# Patient Record
Sex: Female | Born: 1985 | Race: Black or African American | Hispanic: No | Marital: Single | Smoking: Never smoker
Health system: Southern US, Community
[De-identification: ages and names within clinical notes are randomized; demographics above are authoritative.]

## PROBLEM LIST (undated history)

## (undated) DIAGNOSIS — J45909 Unspecified asthma, uncomplicated: Secondary | ICD-10-CM

---

## 2016-05-16 ENCOUNTER — Encounter: Payer: Self-pay | Admitting: *Deleted

## 2016-05-16 ENCOUNTER — Ambulatory Visit
Admission: EM | Admit: 2016-05-16 | Discharge: 2016-05-16 | Disposition: A | Discharge status: Home / Self Care | Payer: BLUE CROSS/BLUE SHIELD | Attending: Family Medicine | Admitting: Family Medicine

## 2016-05-16 DIAGNOSIS — N760 Acute vaginitis: Secondary | ICD-10-CM | POA: Diagnosis not present

## 2016-05-16 DIAGNOSIS — B9689 Other specified bacterial agents as the cause of diseases classified elsewhere: Secondary | ICD-10-CM

## 2016-05-16 DIAGNOSIS — A499 Bacterial infection, unspecified: Secondary | ICD-10-CM

## 2016-05-16 HISTORY — DX: Unspecified asthma, uncomplicated: J45.909

## 2016-05-16 LAB — URINALYSIS COMPLETE WITH MICROSCOPIC (ARMC ONLY)
BILIRUBIN URINE: NEGATIVE
Glucose, UA: NEGATIVE mg/dL
Hgb urine dipstick: NEGATIVE
KETONES UR: NEGATIVE mg/dL
LEUKOCYTES UA: NEGATIVE
NITRITE: NEGATIVE
PH: 7 (ref 5.0–8.0)
PROTEIN: NEGATIVE mg/dL
RBC / HPF: NONE SEEN RBC/hpf (ref 0–5)
SPECIFIC GRAVITY, URINE: 1.015 (ref 1.005–1.030)

## 2016-05-16 LAB — WET PREP, GENITAL
SPERM: NONE SEEN
Trich, Wet Prep: NONE SEEN
Yeast Wet Prep HPF POC: NONE SEEN

## 2016-05-16 LAB — CHLAMYDIA/NGC RT PCR (ARMC ONLY)
Chlamydia Tr: NOT DETECTED
N gonorrhoeae: NOT DETECTED

## 2016-05-16 MED ORDER — METRONIDAZOLE 500 MG PO TABS: 500.0000 mg | ORAL_TABLET | Freq: Two times a day (BID) | ORAL | 0 refills | Status: DC

## 2016-05-16 NOTE — ED Triage Notes (Signed)
Vaginal itching and discharge x2 days.

## 2016-05-16 NOTE — ED Provider Notes (Signed)
[facility]    CSN: [id number] Arrival date & time: 05/16/16  [id number]  First Provider Contact:  None       History   Chief Complaint Chief Complaint  Patient presents with  . Vaginal Discharge  . Vaginal Itching    HPI Nichole Holt is a 30 y.o. female.   The history is provided by the patient.  Vaginal Discharge  Quality:  White Severity:  Moderate Onset quality:  Gradual Duration:  3 days Timing:  Constant Progression:  Worsening Chronicity:  New Context: spontaneously   Context: not after intercourse, not after urination, not at rest, not during bowel movement, not during intercourse, not during pregnancy, not during urination, not genital trauma and not recent antibiotic use   Relieved by:  None tried Ineffective treatments:  None tried Associated symptoms: vaginal itching   Associated symptoms: no abdominal pain, no dyspareunia, no dysuria, no fever, no genital lesions, no nausea, no rash, no urinary frequency, no urinary hesitancy, no urinary incontinence and no vomiting   Risk factors: no new sexual partner, no PID and no STI exposure   Vaginal Itching  Pertinent negatives include no abdominal pain.    Past Medical History:  Diagnosis Date  . Asthma     There are no active problems to display for this patient.   History reviewed. No pertinent surgical history.  OB History    No data available       Home Medications    Prior to Admission medications   Medication Sig Start Date End Date Taking? Authorizing Provider  acyclovir (ZOVIRAX) 200 MG capsule Take 200 mg by mouth 5 (five) times daily.   Yes Historical Provider, MD  metroNIDAZOLE (FLAGYL) 500 MG tablet Take 1 tablet (500 mg total) by mouth 2 (two) times daily. 05/16/16   Payton Mccallumrlando Karlo Goeden, MD    Family History History reviewed. No pertinent family history.  Social History Social History  Substance Use Topics  . Smoking status: Never Smoker  . Smokeless tobacco: Never Used  .  Alcohol use Yes     Allergies   Review of patient's allergies indicates no known allergies.   Review of Systems Review of Systems  Constitutional: Negative for fever.  Gastrointestinal: Negative for abdominal pain, nausea and vomiting.  Genitourinary: Positive for vaginal discharge. Negative for bladder incontinence, dyspareunia, dysuria and hesitancy.     Physical Exam Triage Vital Signs ED Triage Vitals  Enc Vitals Group     BP 05/16/16 1930 116/71     Pulse Rate 05/16/16 1930 78     Resp 05/16/16 1930 16     Temp 05/16/16 1930 97.5 F (36.4 C)     Temp Source 05/16/16 1930 Tympanic     SpO2 05/16/16 1930 100 %     Weight 05/16/16 1931 120 lb (54.4 kg)     Height 05/16/16 1931 5\' 3"  (1.6 m)     Head Circumference --      Peak Flow --      Pain Score 05/16/16 2027 0     Pain Loc --      Pain Edu? --      Excl. in GC? --    No data found.   Updated Vital Signs BP 116/71 (BP Location: Left Arm)   Pulse 78   Temp 97.5 F (36.4 C) (Tympanic)   Resp 16   Ht 5\' 3"  (1.6 m)   Wt 120 lb (54.4 kg)   LMP 03/30/2016   SpO2 100%  BMI 21.26 kg/m   Visual Acuity Right Eye Distance:   Left Eye Distance:   Bilateral Distance:    Right Eye Near:   Left Eye Near:    Bilateral Near:     Physical Exam  Constitutional: She appears well-developed and well-nourished. No distress.  Genitourinary: Pelvic exam was performed with patient supine. No labial fusion. There is no rash, tenderness, lesion or injury on the right labia. There is no rash, tenderness, lesion or injury on the left labia. No erythema, tenderness or bleeding in the vagina. No foreign body in the vagina. No signs of injury around the vagina. Vaginal discharge found.  Skin: She is not diaphoretic.  Nursing note and vitals reviewed.    UC Treatments / Results  Labs (all labs ordered are listed, but only abnormal results are displayed) Labs Reviewed  WET PREP, GENITAL - Abnormal; Notable for the  following:       Result Value   Clue Cells Wet Prep HPF POC PRESENT (*)    WBC, Wet Prep HPF POC FEW (*)    All other components within normal limits  URINALYSIS COMPLETEWITH MICROSCOPIC ([facility] ONLY) - Abnormal; Notable for the following:    Bacteria, UA RARE (*)    Squamous Epithelial / LPF 0-5 (*)    All other components within normal limits  URINE CULTURE  CHLAMYDIA/NGC RT PCR ([facility] ONLY)    EKG  EKG Interpretation None       Radiology No results found.  Procedures Procedures (including critical care time)  Medications Ordered in UC Medications - No data to display   Initial Impression / Assessment and Plan / UC Course  I have reviewed the triage vital signs and the nursing notes.  Pertinent labs & imaging results that were available during my care of the patient were reviewed by me and considered in my medical decision making (see chart for details).  Clinical Course      Final Clinical Impressions(s) / UC Diagnoses   Final diagnoses:  BV (bacterial vaginosis)    New Prescriptions Discharge Medication List as of 05/16/2016  8:20 PM    START taking these medications   Details  metroNIDAZOLE (FLAGYL) 500 MG tablet Take 1 tablet (500 mg total) by mouth 2 (two) times daily., Starting Mon 05/16/2016, Normal       1. Lab results and diagnosis reviewed with patient 2. rx as per orders above; reviewed possible side effects, interactions, risks and benefits  3. Follow-up prn if symptoms worsen or don't improve   Payton Mccallum, MD 05/16/16 2041

## 2016-05-18 LAB — URINE CULTURE: CULTURE: NO GROWTH

## 2019-04-11 ENCOUNTER — Ambulatory Visit
Admission: EM | Admit: 2019-04-11 | Discharge: 2019-04-11 | Disposition: A | Discharge status: Home / Self Care | Payer: BLUE CROSS/BLUE SHIELD | Attending: Family Medicine | Admitting: Family Medicine

## 2019-04-11 ENCOUNTER — Other Ambulatory Visit: Payer: Self-pay

## 2019-04-11 ENCOUNTER — Encounter: Payer: Self-pay | Admitting: Emergency Medicine

## 2019-04-11 DIAGNOSIS — Z113 Encounter for screening for infections with a predominantly sexual mode of transmission: Secondary | ICD-10-CM | POA: Diagnosis not present

## 2019-04-11 DIAGNOSIS — N898 Other specified noninflammatory disorders of vagina: Secondary | ICD-10-CM | POA: Diagnosis not present

## 2019-04-11 DIAGNOSIS — N760 Acute vaginitis: Secondary | ICD-10-CM | POA: Diagnosis not present

## 2019-04-11 DIAGNOSIS — Z3202 Encounter for pregnancy test, result negative: Secondary | ICD-10-CM

## 2019-04-11 DIAGNOSIS — B9689 Other specified bacterial agents as the cause of diseases classified elsewhere: Secondary | ICD-10-CM

## 2019-04-11 LAB — URINALYSIS, COMPLETE (UACMP) WITH MICROSCOPIC
Bacteria, UA: NONE SEEN
Bilirubin Urine: NEGATIVE
Glucose, UA: NEGATIVE mg/dL
Hgb urine dipstick: NEGATIVE
Ketones, ur: NEGATIVE mg/dL
Nitrite: NEGATIVE
Protein, ur: NEGATIVE mg/dL
RBC / HPF: NONE SEEN RBC/hpf (ref 0–5)
Specific Gravity, Urine: 1.01 (ref 1.005–1.030)
pH: 6 (ref 5.0–8.0)

## 2019-04-11 LAB — WET PREP, GENITAL
Sperm: NONE SEEN
Trich, Wet Prep: NONE SEEN
Yeast Wet Prep HPF POC: NONE SEEN

## 2019-04-11 LAB — PREGNANCY, URINE: Preg Test, Ur: NEGATIVE

## 2019-04-11 MED ORDER — METRONIDAZOLE 500 MG PO TABS: 500.0000 mg | ORAL_TABLET | Freq: Two times a day (BID) | ORAL | 0 refills | Status: DC

## 2019-04-11 NOTE — ED Provider Notes (Signed)
[facility] [place] [place]   Name: Shadawn Hanaway DOB: Oct 31, 1985 MRN: [id number] CSN: [id number] PCP: System, Provider Not In  Arrival date and time:  04/11/19 1359  Chief Complaint:  Vaginal Itching and Vaginal Discharge   NOTE: Prior to seeing the patient today, I have reviewed the triage nursing documentation and vital signs. Clinical staff has updated patient's PMH/PSHx, current medication list, and drug allergies/intolerances to ensure comprehensive history available to assist in medical decision making.   History:   HPI: Jaelle Campanile is a 33 y.o. female who presents today with complaints of vaginal itching and discharge that started about a week ago. She describes the discharge as being "cloudy" with no appreciated odor. Patient advises that she had protected sexual activity, however post-coital examination of the condom revealed that it had failed/broke. She denies pelvic/vaginal, abdominal, or back pain. She has not experienced any vaginal bleeding. She has no urinary symptoms; no dysuria, frequency, or urgency. She has not appreciated any gross hematuria, nor has she noticed her urine being malodorous. Patient denies any associated nausea, vomiting, fever, and chills.  Patient's last menstrual period was 03/11/2019. There are no concerns that she is currently pregnant.   Past Medical History:  Diagnosis Date  . Asthma     History reviewed. No pertinent surgical history.  History reviewed. No pertinent family history.  Social History   Tobacco Use  . Smoking status: Never Smoker  . Smokeless tobacco: Never Used  Substance Use Topics  . Alcohol use: Not Currently  . Drug use: Never    There are no active problems to display for this patient.   Home Medications:    No outpatient medications have been marked as taking for the 04/11/19 encounter [facility] Encounter).    Allergies:   Patient has no known allergies.  Review of Systems (ROS): Review of Systems  Constitutional:  Negative for chills and fever.  Respiratory: Negative for cough and shortness of breath.   Cardiovascular: Negative for chest pain and palpitations.  Gastrointestinal: Negative for abdominal pain, diarrhea, nausea and vomiting.  Genitourinary: Positive for vaginal discharge. Negative for decreased urine volume, dyspareunia, dysuria, flank pain, frequency, genital sores, hematuria, menstrual problem, pelvic pain, urgency, vaginal bleeding and vaginal pain.  Musculoskeletal: Negative for back pain.  All other systems reviewed and are negative.    Vital Signs: Today's Vitals   04/11/19 1412 04/11/19 1415  BP:  118/74  Pulse:  (!) 105  Resp:  18  Temp:  98.4 F (36.9 C)  TempSrc:  Oral  SpO2:  100%  Weight: 125 lb (56.7 kg)   Height: 5\' 2"  (1.575 m)   PainSc: 0-No pain     Physical Exam: Physical Exam  Constitutional: She is oriented to person, place, and time and well-developed, well-nourished, and in no distress.  HENT:  Head: Normocephalic and atraumatic.  Mouth/Throat: Mucous membranes are normal.  Eyes: EOM are normal.  Neck: Normal range of motion. Neck supple.  Cardiovascular: Normal rate.  Pulmonary/Chest: Effort normal. No respiratory distress.  Abdominal: Soft. Normal appearance and bowel sounds are normal. There is no abdominal tenderness. There is no CVA tenderness.  Genitourinary:    Genitourinary Comments: Exam deferred. No pelvic pain. Patient elected to self swab for wet prep and DNA probe for GC.   Neurological: She is alert and oriented to person, place, and time. Gait normal. GCS score is 15.  Skin: Skin is warm and dry. No rash noted.  Psychiatric: Mood, memory, affect and judgment normal.  Nursing  note and vitals reviewed.   Urgent Care Treatments / Results:   LABS: PLEASE NOTE: all labs that were ordered this encounter are listed, however only abnormal results are displayed. Labs Reviewed  WET PREP, GENITAL - Abnormal; Notable for the following  components:      Result Value   Clue Cells Wet Prep HPF POC PRESENT (*)    WBC, Wet Prep HPF POC MODERATE (*)    All other components within normal limits  URINALYSIS, COMPLETE (UACMP) WITH MICROSCOPIC - Abnormal; Notable for the following components:   Color, Urine STRAW (*)    Leukocytes,Ua TRACE (*)    All other components within normal limits  GC/CHLAMYDIA PROBE AMP  PREGNANCY, URINE    EKG: -None  RADIOLOGY: No results found.  PROCEDURES: Procedures  MEDICATIONS RECEIVED THIS VISIT: Medications - No data to display  PERTINENT CLINICAL COURSE NOTES/UPDATES:   Initial Impression / Assessment and Plan / Urgent Care Course:  Pertinent labs & imaging results that were available during my care of the patient were personally reviewed by me and considered in my medical decision making (see lab/imaging section of note for values and interpretations).  Alphonzo LemmingsWhitney Judithann GravesFarrar is a 33 y.o. female who [facility] with complaints of Vaginal Itching and Vaginal Discharge   Patient is well appearing overall in clinic today. She does not appear to be in any acute distress. Presenting symptoms (see HPI) and exam as documented above. UA (+) for trace leukocytes; no symptoms. DNA probe for GC collected by patient and sent for testing. Patient to be contacted with further directives regarding treatment should test result positive. Wet prep (+) for clue cells. Will treat with a 7 day course of metronidazole. Patient advised to avoid alcohol to prevent disulfiram like reaction (nausea and vomiting).   Discussed follow up with primary care physician in 1 week for re-evaluation. I have reviewed the follow up and strict return precautions for any new or worsening symptoms. Patient is aware of symptoms that would be deemed urgent/emergent, and would thus require further evaluation either here or in the emergency department. At the time of discharge, she verbalized understanding and  consent with the discharge plan as it was reviewed with her. All questions were fielded by provider and/or clinic staff prior to patient discharge.    Final Clinical Impressions / Urgent Care Diagnoses:   Final diagnoses:  Screening for STD (sexually transmitted disease)  BV (bacterial vaginosis)  Vaginal discharge    New Prescriptions:  [place] Controlled Substance Registry consulted? Not Applicable  Meds ordered this encounter  Medications  . metroNIDAZOLE (FLAGYL) 500 MG tablet    Sig: Take 1 tablet (500 mg total) by mouth 2 (two) times daily.    Dispense:  14 tablet    Refill:  0    Recommended Follow up Care:  Patient encouraged to follow up with the following provider within the specified time frame, or sooner as dictated by the severity of her symptoms. As always, she was instructed that for any urgent/emergent care needs, she should seek care either here or in the emergency department for more immediate evaluation.  Follow-up Information    PCP In 1 week.   Why: General reassessment of symptoms if not improving        NOTE: This note was prepared using Scientist, clinical (histocompatibility and immunogenetics)Dragon dictation software along with smaller Lobbyistphrase technology. Despite my best ability to proofread, there is the potential that transcriptional errors may still occur from this process, and  are completely unintentional.    Verlee MonteGray, Jendayi Berling E, NP 04/12/19 1019

## 2019-04-11 NOTE — Discharge Instructions (Addendum)
It was very nice seeing you today in clinic. Thank you for entrusting me with your care.   Please utilize the medications that we discussed. Your prescriptions have been called in to your pharmacy. Avoid alcohol while on this medication - it will make you sick.   Make arrangements to follow up with your regular doctor in 1 week for re-evaluation if not improving. If your symptoms/condition worsens, please seek follow up care either here or in the ER. Please remember, our [facility] providers are "right here with you" when you need [place].   Again, it was my pleasure to take care of you today. Thank you for choosing our clinic. I hope that you start to feel better quickly.   Honor Loh, MSN, APRN, FNP-C, CEN Advanced Practice Provider [facility]

## 2019-04-11 NOTE — ED Triage Notes (Signed)
Patient c/o vaginal discharge and itching that started 1 week ago. She states she was having sex and the condom broke and she would like to be tested for STDs.

## 2019-04-16 LAB — GC/CHLAMYDIA PROBE AMP
Chlamydia trachomatis, NAA: NEGATIVE
Neisseria Gonorrhoeae by PCR: NEGATIVE

## 2019-06-13 ENCOUNTER — Other Ambulatory Visit: Payer: Self-pay

## 2019-06-13 DIAGNOSIS — Z20822 Contact with and (suspected) exposure to covid-19: Secondary | ICD-10-CM

## 2019-06-14 LAB — NOVEL CORONAVIRUS, NAA: SARS-CoV-2, NAA: NOT DETECTED

## 2019-11-22 ENCOUNTER — Ambulatory Visit: Payer: BLUE CROSS/BLUE SHIELD

## 2019-12-05 ENCOUNTER — Ambulatory Visit: Payer: BLUE CROSS/BLUE SHIELD | Attending: Internal Medicine

## 2019-12-05 DIAGNOSIS — Z20822 Contact with and (suspected) exposure to covid-19: Secondary | ICD-10-CM

## 2019-12-06 LAB — NOVEL CORONAVIRUS, NAA: SARS-CoV-2, NAA: NOT DETECTED

## 2019-12-06 LAB — SARS-COV-2, NAA 2 DAY TAT

## 2020-04-18 ENCOUNTER — Emergency Department: Payer: BLUE CROSS/BLUE SHIELD

## 2020-04-18 ENCOUNTER — Other Ambulatory Visit: Payer: Self-pay

## 2020-04-18 ENCOUNTER — Encounter: Payer: Self-pay | Admitting: Emergency Medicine

## 2020-04-18 DIAGNOSIS — F1210 Cannabis abuse, uncomplicated: Secondary | ICD-10-CM | POA: Insufficient documentation

## 2020-04-18 DIAGNOSIS — E860 Dehydration: Secondary | ICD-10-CM | POA: Diagnosis not present

## 2020-04-18 DIAGNOSIS — Z20822 Contact with and (suspected) exposure to covid-19: Secondary | ICD-10-CM | POA: Insufficient documentation

## 2020-04-18 DIAGNOSIS — E876 Hypokalemia: Secondary | ICD-10-CM | POA: Insufficient documentation

## 2020-04-18 DIAGNOSIS — J45909 Unspecified asthma, uncomplicated: Secondary | ICD-10-CM | POA: Diagnosis not present

## 2020-04-18 DIAGNOSIS — R0602 Shortness of breath: Secondary | ICD-10-CM | POA: Diagnosis present

## 2020-04-18 LAB — CBC
HCT: 38 % (ref 36.0–46.0)
Hemoglobin: 13.6 g/dL (ref 12.0–15.0)
MCH: 31.3 pg (ref 26.0–34.0)
MCHC: 35.8 g/dL (ref 30.0–36.0)
MCV: 87.6 fL (ref 80.0–100.0)
Platelets: 251 10*3/uL (ref 150–400)
RBC: 4.34 MIL/uL (ref 3.87–5.11)
RDW: 11.2 % — ABNORMAL LOW (ref 11.5–15.5)
WBC: 8.7 10*3/uL (ref 4.0–10.5)
nRBC: 0 % (ref 0.0–0.2)

## 2020-04-18 NOTE — ED Triage Notes (Signed)
First note: per ems pt took a "weed cookie" at 2130 today. ns bolus given, hr 125, 198/100, fsbs 155. Pt is having tingling, cramping in extremities. Per ems pt is "paranoid".

## 2020-04-18 NOTE — ED Notes (Signed)
Pt's visitor terry is reachable at [phone]

## 2020-04-18 NOTE — ED Triage Notes (Addendum)
Patient brought in by ems. Patient had a "weed cookie" about 21:30 and now feeling funny. Patient states that she is having chest pain and a headache.

## 2020-04-19 ENCOUNTER — Emergency Department
Admission: EM | Admit: 2020-04-19 | Discharge: 2020-04-19 | Disposition: A | Discharge status: Home / Self Care | Payer: BLUE CROSS/BLUE SHIELD | Attending: Emergency Medicine | Admitting: Emergency Medicine

## 2020-04-19 ENCOUNTER — Emergency Department: Payer: BLUE CROSS/BLUE SHIELD

## 2020-04-19 DIAGNOSIS — E876 Hypokalemia: Secondary | ICD-10-CM

## 2020-04-19 DIAGNOSIS — E860 Dehydration: Secondary | ICD-10-CM

## 2020-04-19 DIAGNOSIS — F1210 Cannabis abuse, uncomplicated: Secondary | ICD-10-CM

## 2020-04-19 LAB — GLUCOSE, CAPILLARY: Glucose-Capillary: 75 mg/dL (ref 70–99)

## 2020-04-19 LAB — BASIC METABOLIC PANEL
Anion gap: 18 — ABNORMAL HIGH (ref 5–15)
BUN: 18 mg/dL (ref 6–20)
CO2: 17 mmol/L — ABNORMAL LOW (ref 22–32)
Calcium: 9.2 mg/dL (ref 8.9–10.3)
Chloride: 103 mmol/L (ref 98–111)
Creatinine, Ser: 0.83 mg/dL (ref 0.44–1.00)
GFR calc Af Amer: >60 mL/min (ref >60)
GFR calc non Af Amer: >60 mL/min (ref >60)
Glucose, Bld: 191 mg/dL — ABNORMAL HIGH (ref 70–99)
Potassium: 2.8 mmol/L — ABNORMAL LOW (ref 3.5–5.1)
Sodium: 138 mmol/L (ref 135–145)

## 2020-04-19 LAB — TROPONIN I (HIGH SENSITIVITY)
Troponin I (High Sensitivity): 5 ng/L (ref <18)
Troponin I (High Sensitivity): 14 ng/L (ref <18)

## 2020-04-19 LAB — POCT PREGNANCY, URINE: Preg Test, Ur: NEGATIVE

## 2020-04-19 LAB — SARS CORONAVIRUS 2 BY RT PCR (HOSPITAL ORDER, PERFORMED IN ~~LOC~~ HOSPITAL LAB): SARS Coronavirus 2: NEGATIVE

## 2020-04-19 MED ORDER — POTASSIUM CHLORIDE CRYS ER 20 MEQ PO TBCR
40.0000 meq | EXTENDED_RELEASE_TABLET | Freq: Once | ORAL | Status: AC
  Administered 2020-04-19: 40 meq via ORAL
  Filled 2020-04-19: qty 2

## 2020-04-19 MED ORDER — SODIUM CHLORIDE 0.9 % IV BOLUS
1000.0000 mL | Freq: Once | INTRAVENOUS | Status: AC
  Administered 2020-04-19: 1000 mL via INTRAVENOUS

## 2020-04-19 MED ORDER — ONDANSETRON HCL 4 MG/2ML IJ SOLN: 4.0000 mg | Freq: Once | INTRAMUSCULAR | Status: AC

## 2020-04-19 MED ORDER — ONDANSETRON HCL 4 MG/2ML IJ SOLN
INTRAMUSCULAR | Status: AC
  Administered 2020-04-19: 4 mg via INTRAVENOUS
  Filled 2020-04-19: qty 2

## 2020-04-19 NOTE — ED Notes (Signed)
Assessment completed by MD prior to d/c

## 2020-04-19 NOTE — ED Provider Notes (Signed)
[facility] Emergency Department Provider Note  ____________________________________________   First MD Initiated Contact with Patient 04/19/20 [phone]     (approximate)  I have reviewed the triage vital signs and the nursing notes.   HISTORY  Chief Complaint Ingestion   HPI Nichole Holt is a 34 y.o. female with past medical history of asthma who presents for assessment of palpitations and shortness of breath that began shortly after eating edible THC product around 10 PM yesterday evening.  Patient states she has some nausea and vomiting as well but that all her symptoms have since resolved.  No prior similar episodes.  No other clear precipitating events.  Patient states that prior to this she has been in her usual state of health without any other recent fevers, chills, vomiting, diarrhea, dysuria, chest pain, dental pain, cough back pain, blood in her stool, blood in her urine, extremity pain, other acute complaints.  Denies other illegal drug use or EtOH use.  States she is feeling back to normal.         Past Medical History:  Diagnosis Date  . Asthma     There are no problems to display for this patient.   History reviewed. No pertinent surgical history.  Prior to Admission medications   Medication Sig Start Date End Date Taking? Authorizing Provider  metroNIDAZOLE (FLAGYL) 500 MG tablet Take 1 tablet (500 mg total) by mouth 2 (two) times daily. 04/11/19   Verlee Monte, NP    Allergies Patient has no known allergies.  No family history on file.  Social History Social History   Tobacco Use  . Smoking status: Never Smoker  . Smokeless tobacco: Never Used  Substance Use Topics  . Alcohol use: Yes  . Drug use: Yes    Types: Marijuana    Review of Systems  Review of Systems  Constitutional: Negative for chills and fever.  HENT: Negative for sore throat.   Eyes: Negative for pain.  Respiratory: Positive for shortness of breath.  Negative for cough and stridor.   Cardiovascular: Negative for chest pain.  Gastrointestinal: Positive for nausea and vomiting.  Skin: Negative for rash.  Neurological: Negative for seizures, loss of consciousness and headaches.  Psychiatric/Behavioral: Negative for suicidal ideas. The patient is nervous/anxious.   All other systems reviewed and are negative.     ____________________________________________   PHYSICAL EXAM:  VITAL SIGNS: ED Triage Vitals  Enc Vitals Group     BP 04/18/20 2321 (!) 176/95     Pulse Rate 04/18/20 2321 (!) 119     Resp 04/18/20 2321 (!) 24     Temp 04/19/20 0138 98.3 F (36.8 C)     Temp Source 04/19/20 0138 Oral     SpO2 04/18/20 2321 100 %     Weight 04/18/20 2322 127 lb (57.6 kg)     Height 04/18/20 2322 5\' 2"  (1.575 m)     Head Circumference --      Peak Flow --      Pain Score 04/18/20 2321 10     Pain Loc --      Pain Edu? --      Excl. in GC? --    Vitals:   04/19/20 0406 04/19/20 0810  BP: 131/71 133/79  Pulse: 88 65  Resp: 15   Temp: 97.9 F (36.6 C)   SpO2: 100% 100%   Physical Exam Vitals and nursing note reviewed.  Constitutional:      General: She is not  in acute distress.    Appearance: She is well-developed.  HENT:     Head: Normocephalic and atraumatic.     Right Ear: External ear normal.     Left Ear: External ear normal.     Nose: Nose normal.     Mouth/Throat:     Mouth: Mucous membranes are moist.  Eyes:     Conjunctiva/sclera: Conjunctivae normal.  Cardiovascular:     Rate and Rhythm: Normal rate and regular rhythm.     Heart sounds: No murmur heard.   Pulmonary:     Effort: Pulmonary effort is normal. No respiratory distress.     Breath sounds: Normal breath sounds.  Abdominal:     Palpations: Abdomen is soft.     Tenderness: There is no abdominal tenderness.  Musculoskeletal:     Cervical back: Neck supple.  Skin:    General: Skin is warm and dry.     Capillary Refill: Capillary refill takes  less than 2 seconds.  Neurological:     Mental Status: She is alert and oriented to person, place, and time.  Psychiatric:        Mood and Affect: Mood normal.      ____________________________________________   LABS (all labs ordered are listed, but only abnormal results are displayed)  Labs Reviewed  BASIC METABOLIC PANEL - Abnormal; Notable for the following components:      Result Value   Potassium 2.8 (*)    CO2 17 (*)    Glucose, Bld 191 (*)    Anion gap 18 (*)    All other components within normal limits  CBC - Abnormal; Notable for the following components:   RDW 11.2 (*)    All other components within normal limits  SARS CORONAVIRUS 2 BY RT PCR (HOSPITAL ORDER, PERFORMED IN [place] [facility])  GLUCOSE, CAPILLARY  POC URINE PREG, ED  POCT PREGNANCY, URINE  CBG MONITORING, ED  TROPONIN I (HIGH SENSITIVITY)  TROPONIN I (HIGH SENSITIVITY)   ____________________________________________  EKG  Sinus tachycardia with a ventricular rate of 120, normal axis, unremarkable intervals, and no clear evidence of acute ischemia or other significant underlying arrhythmia. ____________________________________________  RADIOLOGY  ED MD interpretation:  Unremarkable.   Official radiology report(s): DG Chest 2 View  Result Date: 04/19/2020 CLINICAL DATA:  Chest pain after edible EXAM: CHEST - 2 VIEW COMPARISON:  None. FINDINGS: No consolidation, features of edema, pneumothorax, or effusion. Pulmonary vascularity is normally distributed. The cardiomediastinal contours are unremarkable. No acute osseous osseous abnormality. Air-filled splenic flexure is nonspecific. No other significant soft tissue abnormality. IMPRESSION: 1. No acute cardiopulmonary abnormality. 2. Nonspecific air-filled splenic flexure. Correlate with symptoms and abdominal exam. Electronically Signed   By: Kreg Shropshire M.D.   On: 04/19/2020 01:14     ____________________________________________   PROCEDURES  Procedure(s) performed (including Critical Care):  Procedures   ____________________________________________   INITIAL IMPRESSION / ASSESSMENT AND PLAN / ED COURSE        Overall patient's history, exam, and ED work-up is consistent with dehydration and stress response likely secondary to [employer] overdose.  On my assessment patient is calm with a heart rate of 88 and respiratory rate of 15 with SPO2 saturation of 100%.  Exam as above.  Given that her symptoms were precipitated by ingestion of a THC edible and that she is now feeling asymptomatic with normalization of her vital signs I would low suspicion for other significant metabolic derangement, acute infectious process, traumatic injury, or other immediate  life-threatening process.  Patient did request Covid swab.  Level suspicion for acute Covid infection at this time Covid PCR was sent.  We will plan to discharge with plan for close outpatient follow-up.  Discharged stable condition.  Strict return precautions advised and discussed.  Medications  ondansetron (ZOFRAN) injection 4 mg (4 mg Intravenous Given 04/19/20 0009)  sodium chloride 0.9 % bolus 1,000 mL (0 mLs Intravenous Stopped 04/19/20 0806)  potassium chloride SA (KLOR-CON) CR tablet 40 mEq (40 mEq Oral Given 04/19/20 0803)      ____________________________________________   FINAL CLINICAL IMPRESSION(S) / ED DIAGNOSES  Final diagnoses:  Dehydration  Hypokalemia  Mild tetrahydrocannabinol (THC) abuse     ED Discharge Orders    None       Note:  This document was prepared using Dragon voice recognition software and may include unintentional dictation errors.   Gilles Chiquito, MD 04/19/20 [phone]

## 2021-03-31 IMAGING — CR DG CHEST 2V
1 series · 2 of 2 positions shown · non-contrast
Comparison: None.

CLINICAL DATA: Chest pain after edible

EXAM:
CHEST - 2 VIEW

[Series 1: dg chest 2 view · 0.14mm/px · 2 of 2 slices shown]
[im 1/2]
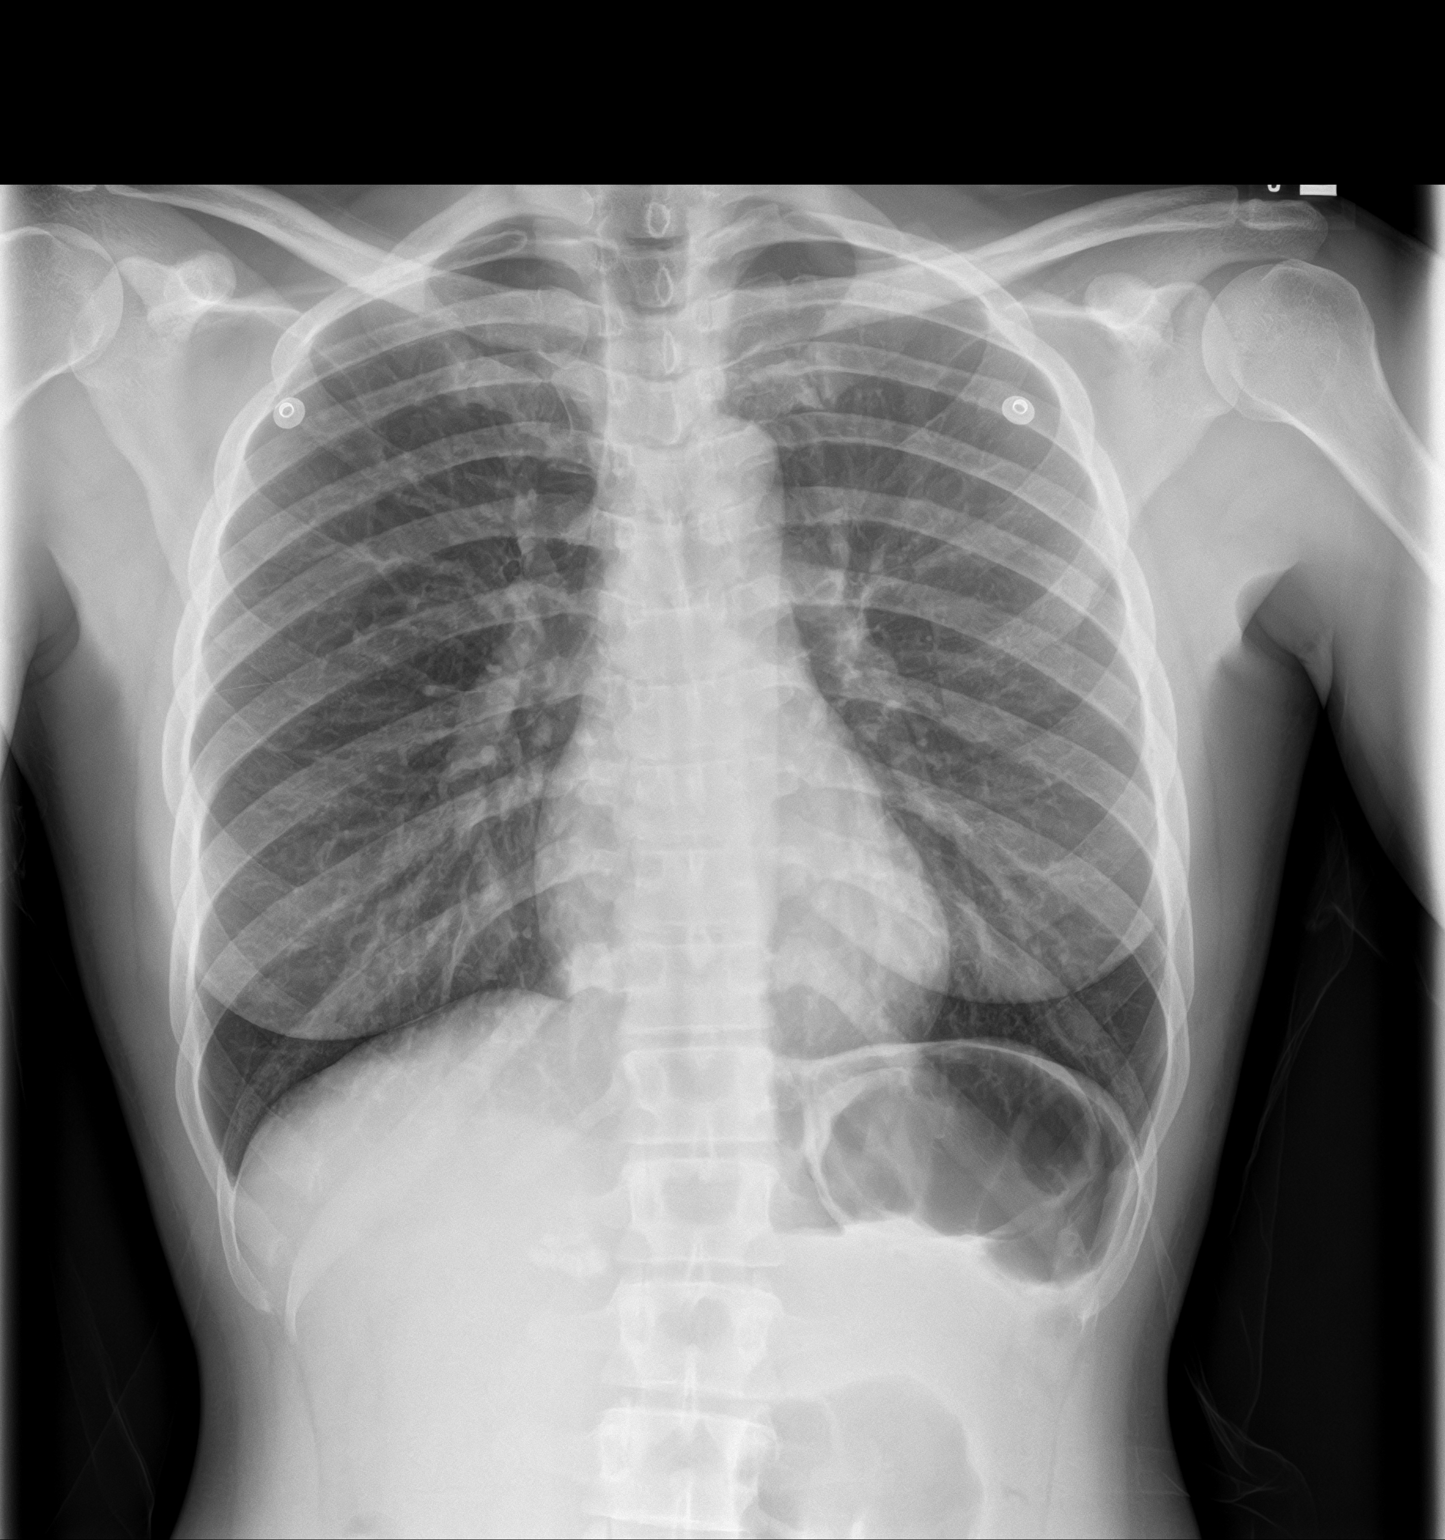
[im 2/2]
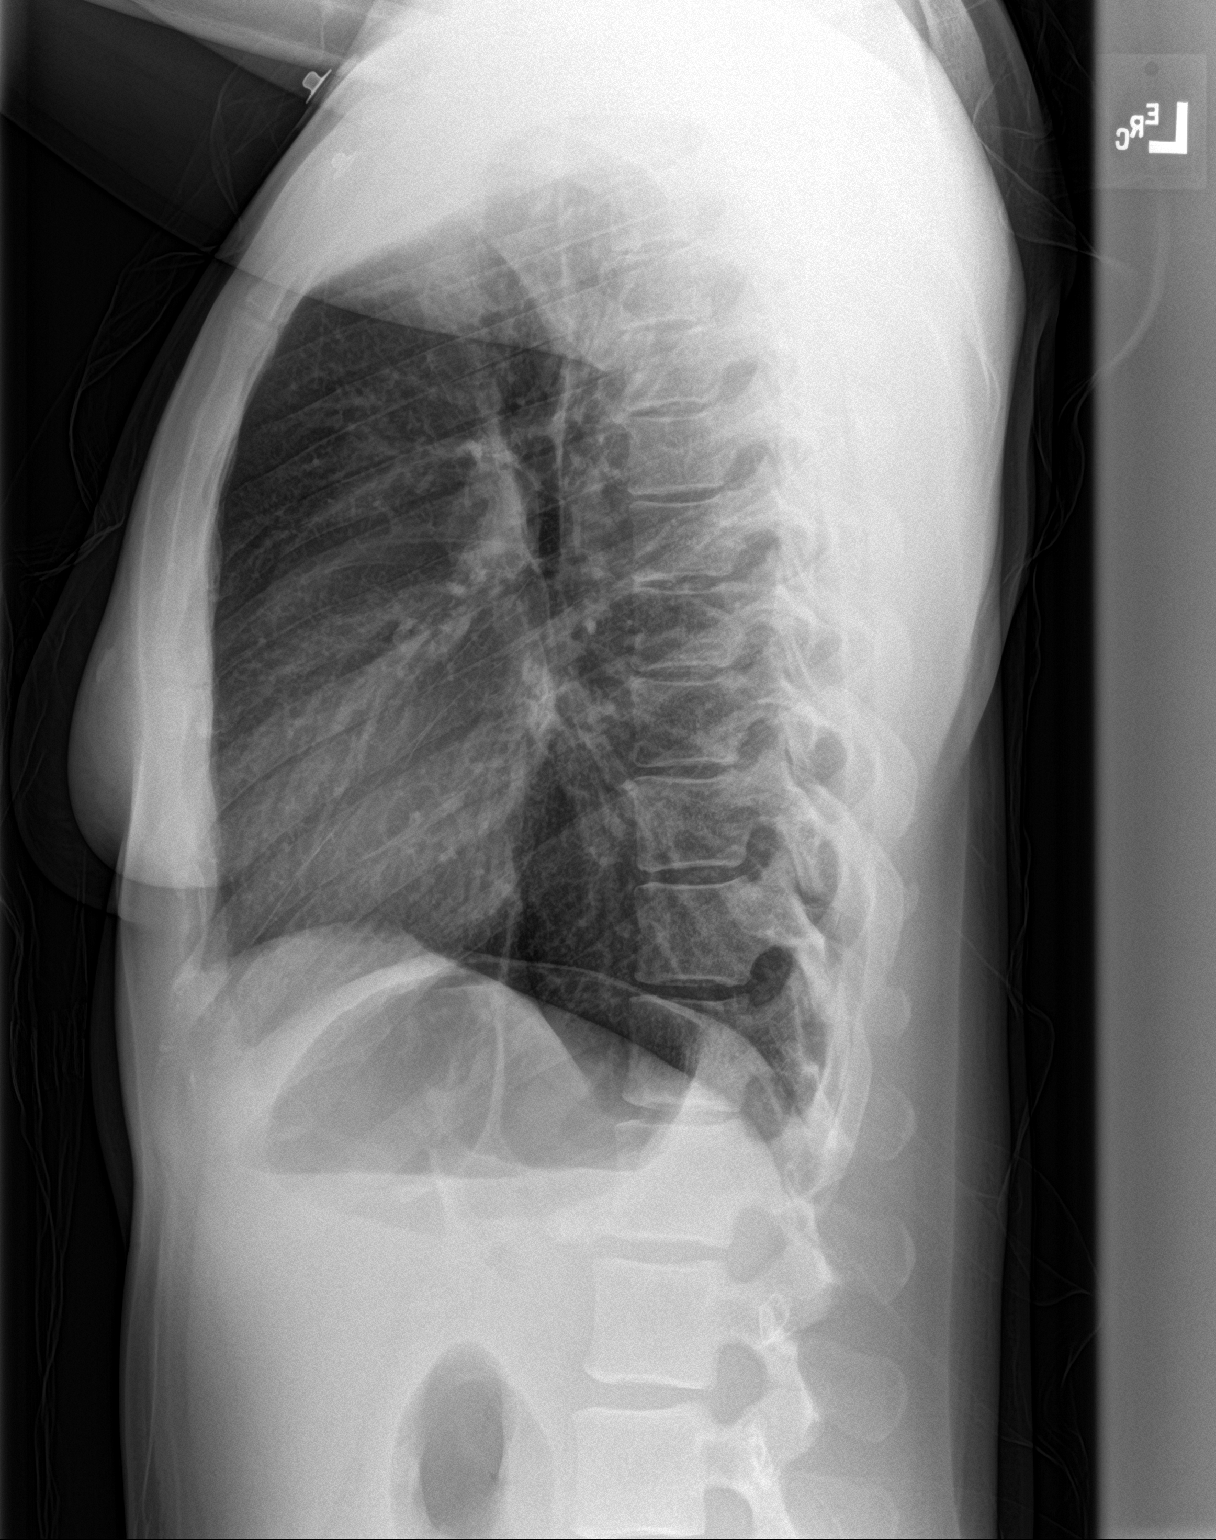

[2 of 2 positions shown; findings below may reference images not displayed]

FINDINGS: No consolidation, features of edema, pneumothorax, or effusion.
Pulmonary vascularity is normally distributed. The cardiomediastinal
contours are unremarkable. No acute osseous osseous abnormality.
Air-filled splenic flexure is nonspecific. No other significant soft
tissue abnormality.
IMPRESSION: 1. No acute cardiopulmonary abnormality.
2. Nonspecific air-filled splenic flexure. Correlate with symptoms
and abdominal exam.

## 2021-09-18 ENCOUNTER — Other Ambulatory Visit: Payer: Self-pay

## 2021-09-18 ENCOUNTER — Ambulatory Visit
Admission: RE | Admit: 2021-09-18 | Discharge: 2021-09-18 | Disposition: A | Discharge status: Home / Self Care | Payer: BLUE CROSS/BLUE SHIELD | Source: Ambulatory Visit | Attending: Family Medicine | Admitting: Family Medicine

## 2021-09-18 VITALS — BP 114/71 | HR 65 | Temp 98.2°F | Resp 16

## 2021-09-18 DIAGNOSIS — N760 Acute vaginitis: Secondary | ICD-10-CM | POA: Diagnosis not present

## 2021-09-18 LAB — POCT URINALYSIS DIP (MANUAL ENTRY)
Bilirubin, UA: NEGATIVE
Blood, UA: NEGATIVE
Glucose, UA: NEGATIVE mg/dL
Ketones, POC UA: NEGATIVE mg/dL
Leukocytes, UA: NEGATIVE
Nitrite, UA: NEGATIVE
Protein Ur, POC: NEGATIVE mg/dL
Spec Grav, UA: 1.01 (ref 1.010–1.025)
Urobilinogen, UA: 0.2 E.U./dL
pH, UA: 5.5 (ref 5.0–8.0)

## 2021-09-18 LAB — POCT URINE PREGNANCY: Preg Test, Ur: NEGATIVE

## 2021-09-18 MED ORDER — METRONIDAZOLE 0.75 % VA GEL: 1.0000 | Freq: Two times a day (BID) | VAGINAL | 0 refills | Status: AC

## 2021-09-18 MED ORDER — FLUCONAZOLE 150 MG PO TABS: 150.0000 mg | ORAL_TABLET | ORAL | 0 refills | Status: AC | PRN

## 2021-09-18 NOTE — Discharge Instructions (Addendum)
Vaginal cytology will result within 48 hours.  If any additional treatment is warranted we will notify you of time. Recommend starting the Diflucan to see if symptoms resolve prior to starting the metronidazole while waiting on your results.  If your results are positive for BV then I recommend starting metronidazole.

## 2021-09-18 NOTE — ED Triage Notes (Signed)
Pt presents with vaginal itching x 2 days. She is concerned for yeast or BV.

## 2021-09-18 NOTE — ED Provider Notes (Signed)
Renaldo Fiddler    CSN: [id number] Arrival date & time: 09/18/21  1103      History   Chief Complaint Chief Complaint  Patient presents with   Vaginal Itching    HPI Nichole Holt is a 36 y.o. female.   HPI Patient presents today with symptoms concerning for vaginitis. She endorses vaginal irritation and itching. No urinary symptoms. Patient reports history of both yeast and BV.  No concern for STI. Denies abdominal pain, flank pain, nausea, or vomiting.  Past Medical History:  Diagnosis Date   Asthma     There are no problems to display for this patient.   History reviewed. No pertinent surgical history.  OB History   No obstetric history on file.      Home Medications    Prior to Admission medications   Medication Sig Start Date End Date Taking? Authorizing Provider  fluconazole (DIFLUCAN) 150 MG tablet Take 1 tablet (150 mg total) by mouth every three (3) days as needed (for vaginal itching). 09/18/21  Yes Bing Neighbors, FNP  metroNIDAZOLE (METROGEL VAGINAL) 0.75 % vaginal gel Place 1 Applicatorful vaginally 2 (two) times daily. 09/18/21  Yes Bing Neighbors, FNP  valACYclovir (VALTREX) 500 MG tablet Take 500 mg by mouth daily. 09/04/21   [provider]    Family History No family history on file.  Social History Social History   Tobacco Use   Smoking status: Never   Smokeless tobacco: Never  Vaping Use   Vaping Use: Never used  Substance Use Topics   Alcohol use: Yes   Drug use: Yes    Types: Marijuana     Allergies   Patient has no known allergies.   Review of Systems Review of Systems Pertinent negatives listed in HPI   Physical Exam Triage Vital Signs ED Triage Vitals  Enc Vitals Group     BP 09/18/21 1131 114/71     Pulse Rate 09/18/21 1131 65     Resp 09/18/21 1131 16     Temp 09/18/21 1131 98.2 F (36.8 C)     Temp Source 09/18/21 1131 Oral     SpO2 09/18/21 1131 99 %     Weight --      Height --       Head Circumference --      Peak Flow --      Pain Score 09/18/21 1130 0     Pain Loc --      Pain Edu? --      Excl. in GC? --    No data found.  Updated Vital Signs BP 114/71 (BP Location: Right Arm)    Pulse 65    Temp 98.2 F (36.8 C) (Oral)    Resp 16    LMP 09/11/2021 (Approximate)    SpO2 99%   Visual Acuity Right Eye Distance:   Left Eye Distance:   Bilateral Distance:    Right Eye Near:   Left Eye Near:    Bilateral Near:     Physical Exam General appearance: Alert, well developed, well nourished, cooperative  Head: Normocephalic, without obvious abnormality, atraumatic Respiratory: Respirations even and unlabored, normal respiratory rate Heart: Rate and rhythm normal.  Skin: Skin color, texture, turgor normal. No rashes seen  Psych: Appropriate mood and affect. Neurologic: No obvious focal abnormalities  Vaginal cytology self collected.   UC Treatments / Results  Labs (all labs ordered are listed, but only abnormal results are displayed) Labs Reviewed  POCT URINE  PREGNANCY  POCT URINALYSIS DIP (MANUAL ENTRY)  CERVICOVAGINAL ANCILLARY ONLY    EKG   Radiology No results found.  Procedures Procedures (including critical care time)  Medications Ordered in UC Medications - No data to display  Initial Impression / Assessment and Plan / UC Course  I have reviewed the triage vital signs and the nursing notes.  Pertinent labs & imaging results that were available during my care of the patient were reviewed by me and considered in my medical decision making (see chart for details).    Vaginitis , vaginal cytology pending. RX Diflucan and MetroGel  Office will notify of abnormal results. RTC PRN Final Clinical Impressions(s) / UC Diagnoses   Final diagnoses:  Vaginitis and vulvovaginitis     Discharge Instructions      Vaginal cytology will result within 48 hours.  If any additional treatment is warranted we will notify you of time. Recommend  starting the Diflucan to see if symptoms resolve prior to starting the metronidazole while waiting on your results.  If your results are positive for BV then I recommend starting metronidazole.     ED Prescriptions     Medication Sig Dispense Auth. Provider   fluconazole (DIFLUCAN) 150 MG tablet Take 1 tablet (150 mg total) by mouth every three (3) days as needed (for vaginal itching). 2 tablet Bing Neighbors, FNP   metroNIDAZOLE (METROGEL VAGINAL) 0.75 % vaginal gel Place 1 Applicatorful vaginally 2 (two) times daily. 70 g Bing Neighbors, FNP      PDMP not reviewed this encounter.   Bing Neighbors, FNP 09/18/21 1227

## 2021-09-20 LAB — CERVICOVAGINAL ANCILLARY ONLY
Bacterial Vaginitis (gardnerella): NEGATIVE
Candida Glabrata: NEGATIVE
Candida Vaginitis: NEGATIVE
Chlamydia: NEGATIVE
Comment: NEGATIVE
Comment: NEGATIVE
Comment: NEGATIVE
Comment: NEGATIVE
Comment: NEGATIVE
Comment: NORMAL
Neisseria Gonorrhea: NEGATIVE
Trichomonas: NEGATIVE

## 2023-08-16 ENCOUNTER — Ambulatory Visit
Admission: EM | Admit: 2023-08-16 | Discharge: 2023-08-16 | Disposition: A | Discharge status: Home / Self Care | Payer: BC Managed Care – PPO | Attending: Emergency Medicine | Admitting: Emergency Medicine

## 2023-08-16 DIAGNOSIS — Z8709 Personal history of other diseases of the respiratory system: Secondary | ICD-10-CM | POA: Diagnosis not present

## 2023-08-16 DIAGNOSIS — J0100 Acute maxillary sinusitis, unspecified: Secondary | ICD-10-CM | POA: Diagnosis not present

## 2023-08-16 DIAGNOSIS — Z760 Encounter for issue of repeat prescription: Secondary | ICD-10-CM

## 2023-08-16 MED ORDER — ALBUTEROL SULFATE HFA 108 (90 BASE) MCG/ACT IN AERS: 1.0000 | INHALATION_SPRAY | Freq: Four times a day (QID) | RESPIRATORY_TRACT | 0 refills | Status: AC | PRN

## 2023-08-16 MED ORDER — AMOXICILLIN 875 MG PO TABS: 875.0000 mg | ORAL_TABLET | Freq: Two times a day (BID) | ORAL | 0 refills | Status: AC

## 2023-08-16 NOTE — ED Provider Notes (Signed)
Renaldo Fiddler    CSN: [id number] Arrival date & time: 08/16/23  1517      History   Chief Complaint Chief Complaint  Patient presents with   Cough   Headache   Nasal Congestion    HPI Nichole Holt is a 37 y.o. female.  Patient presents with 1 week history of congestion and cough.  She has been treating her symptoms with Sudafed, Benadryl, Delsym.  She denies fever, ear pain, sore throat, shortness of breath, wheezing.  Her medical history includes asthma.  Her albuterol inhaler has expired.  The history is provided by the patient and medical records.    Past Medical History:  Diagnosis Date   Asthma     There are no active problems to display for this patient.   History reviewed. No pertinent surgical history.  OB History   No obstetric history on file.      Home Medications    Prior to Admission medications   Medication Sig Start Date End Date Taking? Authorizing Provider  albuterol (VENTOLIN HFA) 108 (90 Base) MCG/ACT inhaler Inhale 1-2 puffs into the lungs every 6 (six) hours as needed. 08/16/23  Yes Mickie Bail, NP  amoxicillin (AMOXIL) 875 MG tablet Take 1 tablet (875 mg total) by mouth 2 (two) times daily for 10 days. 08/16/23 08/26/23 Yes Mickie Bail, NP  fluconazole (DIFLUCAN) 150 MG tablet Take 1 tablet (150 mg total) by mouth every three (3) days as needed (for vaginal itching). Patient not taking: Reported on 08/16/2023 09/18/21   Bing Neighbors, NP  metroNIDAZOLE (METROGEL VAGINAL) 0.75 % vaginal gel Place 1 Applicatorful vaginally 2 (two) times daily. Patient not taking: Reported on 08/16/2023 09/18/21   Bing Neighbors, NP  valACYclovir (VALTREX) 500 MG tablet Take 500 mg by mouth as needed. 09/04/21   [provider]    Family History History reviewed. No pertinent family history.  Social History Social History   Tobacco Use   Smoking status: Never   Smokeless tobacco: Never  Vaping Use   Vaping status: Never  Used  Substance Use Topics   Alcohol use: Yes   Drug use: Not Currently    Types: Marijuana     Allergies   Patient has no known allergies.   Review of Systems Review of Systems  Constitutional:  Negative for chills and fever.  HENT:  Positive for congestion. Negative for ear pain and sore throat.   Respiratory:  Positive for cough. Negative for shortness of breath and wheezing.   Cardiovascular:  Negative for chest pain and palpitations.     Physical Exam Triage Vital Signs ED Triage Vitals [08/16/23 1552]  Encounter Vitals Group     BP 116/67     Systolic BP Percentile      Diastolic BP Percentile      Pulse Rate 81     Resp 18     Temp 98.9 F (37.2 C)     Temp src      SpO2 97 %     Weight      Height      Head Circumference      Peak Flow      Pain Score 3     Pain Loc      Pain Education      Exclude from Growth Chart    No data found.  Updated Vital Signs BP 116/67   Pulse 81   Temp 98.9 F (37.2 C)   Resp  18   LMP 07/25/2023   SpO2 97%   Visual Acuity Right Eye Distance:   Left Eye Distance:   Bilateral Distance:    Right Eye Near:   Left Eye Near:    Bilateral Near:     Physical Exam Constitutional:      General: She is not in acute distress. HENT:     Right Ear: Tympanic membrane normal.     Left Ear: Tympanic membrane normal.     Nose: Congestion present.     Mouth/Throat:     Mouth: Mucous membranes are moist.     Pharynx: Oropharynx is clear.  Cardiovascular:     Rate and Rhythm: Normal rate and regular rhythm.     Heart sounds: Normal heart sounds.  Pulmonary:     Effort: Pulmonary effort is normal. No respiratory distress.     Breath sounds: Normal breath sounds. No wheezing.  Skin:    General: Skin is warm and dry.  Neurological:     Mental Status: She is alert.      UC Treatments / Results  Labs (all labs ordered are listed, but only abnormal results are displayed) Labs Reviewed - No data to  display  EKG   Radiology No results found.  Procedures Procedures (including critical care time)  Medications Ordered in UC Medications - No data to display  Initial Impression / Assessment and Plan / UC Course  I have reviewed the triage vital signs and the nursing notes.  Pertinent labs & imaging results that were available during my care of the patient were reviewed by me and considered in my medical decision making (see chart for details).    Acute sinusitis, history of asthma, medication refill.  Afebrile and vital signs are stable.  Lungs are clear and O2 sat is 97% on room air.  Refill of albuterol inhaler sent to pharmacy.  Treating sinus symptoms with amoxicillin x 10 days.  Instructed patient to follow-up with her PCP if she is not improving.  Education provided on sinus infection.  She agrees to plan of care.  Final Clinical Impressions(s) / UC Diagnoses   Final diagnoses:  Acute non-recurrent maxillary sinusitis  History of asthma  Medication refill     Discharge Instructions      Take the amoxicillin and use the albuterol inhaler as directed.  Follow-up with your primary care provider if your symptoms are not improving.     ED Prescriptions     Medication Sig Dispense Auth. Provider   amoxicillin (AMOXIL) 875 MG tablet Take 1 tablet (875 mg total) by mouth 2 (two) times daily for 10 days. 20 tablet Mickie Bail, NP   albuterol (VENTOLIN HFA) 108 (90 Base) MCG/ACT inhaler Inhale 1-2 puffs into the lungs every 6 (six) hours as needed. 18 g Mickie Bail, NP      PDMP not reviewed this encounter.   Mickie Bail, NP 08/16/23 [phone]

## 2023-08-16 NOTE — Discharge Instructions (Addendum)
Take the amoxicillin and use the albuterol inhaler as directed.  Follow up with your primary care provider if your symptoms are not improving.

## 2023-08-16 NOTE — ED Triage Notes (Signed)
Patient to Urgent Care with complaints of productive cough/ nasal congestion. Denies any known fevers.  Reports symptoms started approx 1 week ago. Feels like her cough is worsening  Taking delsym/ allergy medication.
